# Patient Record
Sex: Male | Born: 1965 | Race: Black or African American | Hispanic: No | Marital: Single | Smoking: Current every day smoker
Health system: Southern US, Community
[De-identification: ages and names within clinical notes are randomized; demographics above are authoritative.]

## PROBLEM LIST (undated history)

## (undated) HISTORY — PX: TUMOR REMOVAL: SHX12

## (undated) HISTORY — PX: REPLACEMENT TOTAL KNEE: SUR1224

## (undated) HISTORY — PX: HERNIA REPAIR: SHX51

---

## 2016-04-30 ENCOUNTER — Ambulatory Visit
Admission: RE | Admit: 2016-04-30 | Discharge: 2016-04-30 | Disposition: A | Discharge status: Home / Self Care | Payer: BLUE CROSS/BLUE SHIELD | Source: Ambulatory Visit | Attending: Internal Medicine | Admitting: Internal Medicine

## 2016-04-30 ENCOUNTER — Ambulatory Visit (INDEPENDENT_AMBULATORY_CARE_PROVIDER_SITE_OTHER): Payer: BLUE CROSS/BLUE SHIELD | Admitting: Internal Medicine

## 2016-04-30 ENCOUNTER — Encounter: Payer: Self-pay | Admitting: Internal Medicine

## 2016-04-30 VITALS — BP 138/84 | HR 64 | Temp 98.6°F | Wt 219.0 lb

## 2016-04-30 DIAGNOSIS — M86661 Other chronic osteomyelitis, right tibia and fibula: Secondary | ICD-10-CM

## 2016-04-30 DIAGNOSIS — Z87828 Personal history of other (healed) physical injury and trauma: Secondary | ICD-10-CM

## 2016-04-30 DIAGNOSIS — Z23 Encounter for immunization: Secondary | ICD-10-CM

## 2016-04-30 DIAGNOSIS — Z8619 Personal history of other infectious and parasitic diseases: Secondary | ICD-10-CM

## 2016-04-30 DIAGNOSIS — B182 Chronic viral hepatitis C: Secondary | ICD-10-CM

## 2016-04-30 LAB — CBC WITH DIFFERENTIAL/PLATELET
BASOS ABS: 0 {cells}/uL (ref 0–200)
Basophils Relative: 0 %
EOS ABS: 336 {cells}/uL (ref 15–500)
EOS PCT: 4 %
HCT: 43.2 % (ref 38.5–50.0)
HEMOGLOBIN: 13.9 g/dL (ref 13.2–17.1)
LYMPHS ABS: 4032 {cells}/uL — AB (ref 850–3900)
Lymphocytes Relative: 48 %
MCH: 28.2 pg (ref 27.0–33.0)
MCHC: 32.2 g/dL (ref 32.0–36.0)
MCV: 87.6 fL (ref 80.0–100.0)
MPV: 9.6 fL (ref 7.5–12.5)
Monocytes Absolute: 672 cells/uL (ref 200–950)
Monocytes Relative: 8 %
NEUTROS PCT: 40 %
Neutro Abs: 3360 cells/uL (ref 1500–7800)
Platelets: 288 10*3/uL (ref 140–400)
RBC: 4.93 MIL/uL (ref 4.20–5.80)
RDW: 15.2 % — ABNORMAL HIGH (ref 11.0–15.0)
WBC: 8.4 10*3/uL (ref 3.8–10.8)

## 2016-04-30 LAB — COMPLETE METABOLIC PANEL WITH GFR
ALBUMIN: 4.3 g/dL (ref 3.6–5.1)
ALK PHOS: 68 U/L (ref 40–115)
ALT: 17 U/L (ref 9–46)
AST: 26 U/L (ref 10–40)
BILIRUBIN TOTAL: 0.4 mg/dL (ref 0.2–1.2)
BUN: 7 mg/dL (ref 7–25)
CO2: 26 mmol/L (ref 20–31)
CREATININE: 0.81 mg/dL (ref 0.60–1.35)
Calcium: 9.5 mg/dL (ref 8.6–10.3)
Chloride: 103 mmol/L (ref 98–110)
GFR, Est African American: >89 mL/min (ref >=60)
GLUCOSE: 80 mg/dL (ref 65–99)
Potassium: 4.1 mmol/L (ref 3.5–5.3)
SODIUM: 139 mmol/L (ref 135–146)
TOTAL PROTEIN: 7.7 g/dL (ref 6.1–8.1)

## 2016-04-30 LAB — PROTIME-INR
INR: 1.1
Prothrombin Time: 11.7 s — ABNORMAL HIGH (ref 9.0–11.5)

## 2016-04-30 MED ORDER — PROMETHAZINE HCL 25 MG PO TABS: 25.0000 mg | ORAL_TABLET | Freq: Four times a day (QID) | ORAL | 0 refills | Status: DC | PRN

## 2016-04-30 MED ORDER — DOXYCYCLINE HYCLATE 100 MG PO TABS: 100.0000 mg | ORAL_TABLET | Freq: Two times a day (BID) | ORAL | 1 refills | Status: DC
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
## 2016-05-01 LAB — HEPATITIS C RNA QUANTITATIVE
HCV Quantitative Log: 6.59 {Log} — ABNORMAL HIGH (ref <1.18)
HCV Quantitative: 3886954 IU/mL — ABNORMAL HIGH (ref <15)

## 2016-05-01 LAB — SEDIMENTATION RATE: Sed Rate: 12 mm/hr (ref 0–15)

## 2016-05-01 LAB — HEPATITIS B SURFACE ANTIBODY,QUALITATIVE: Hep B S Ab: POSITIVE — AB

## 2016-05-01 LAB — HIV ANTIBODY (ROUTINE TESTING W REFLEX): HIV 1&2 Ab, 4th Generation: NONREACTIVE

## 2016-05-01 LAB — C-REACTIVE PROTEIN: CRP: 9.4 mg/L — AB (ref <8.0)

## 2016-05-01 LAB — HEPATITIS A ANTIBODY, TOTAL: Hep A Total Ab: NONREACTIVE

## 2016-05-01 LAB — HEPATITIS C ANTIBODY: HCV Ab: REACTIVE — AB

## 2016-05-03 DIAGNOSIS — Z8619 Personal history of other infectious and parasitic diseases: Secondary | ICD-10-CM | POA: Insufficient documentation

## 2016-05-03 DIAGNOSIS — Z87828 Personal history of other (healed) physical injury and trauma: Secondary | ICD-10-CM | POA: Insufficient documentation

## 2016-05-03 DIAGNOSIS — B182 Chronic viral hepatitis C: Secondary | ICD-10-CM | POA: Insufficient documentation

## 2016-05-03 DIAGNOSIS — M86661 Other chronic osteomyelitis, right tibia and fibula: Secondary | ICD-10-CM

## 2016-05-03 HISTORY — DX: Personal history of other (healed) physical injury and trauma: Z87.828

## 2016-05-03 HISTORY — DX: Other chronic osteomyelitis, right tibia and fibula: M86.661

## 2016-05-03 HISTORY — DX: Chronic viral hepatitis C: B18.2

## 2016-05-03 HISTORY — DX: Personal history of other infectious and parasitic diseases: Z86.19

## 2016-05-03 LAB — WOUND CULTURE
Gram Stain: NONE SEEN
Gram Stain: NONE SEEN
Gram Stain: NONE SEEN
Organism ID, Bacteria: NO GROWTH

## 2016-05-05 LAB — HEPATITIS C RNA QUANTITATIVE
HCV QUANT LOG: 6.52 {Log} — AB (ref <1.18)
HCV QUANT: 3344308 [IU]/mL — AB (ref <15)

## 2016-05-06 LAB — HEPATITIS C GENOTYPE

## 2016-06-02 ENCOUNTER — Ambulatory Visit (INDEPENDENT_AMBULATORY_CARE_PROVIDER_SITE_OTHER): Payer: BLUE CROSS/BLUE SHIELD | Admitting: Internal Medicine

## 2016-06-02 ENCOUNTER — Other Ambulatory Visit: Payer: Self-pay | Admitting: Internal Medicine

## 2016-06-02 VITALS — BP 114/93 | HR 62 | Temp 98.6°F | Wt 220.8 lb

## 2016-06-02 DIAGNOSIS — M86661 Other chronic osteomyelitis, right tibia and fibula: Secondary | ICD-10-CM | POA: Diagnosis not present

## 2016-06-02 DIAGNOSIS — B182 Chronic viral hepatitis C: Secondary | ICD-10-CM

## 2016-06-02 DIAGNOSIS — L988 Other specified disorders of the skin and subcutaneous tissue: Secondary | ICD-10-CM | POA: Diagnosis not present

## 2016-06-02 NOTE — Progress Notes (Signed)
HPI: Doristine SectionBrian Dalesandro is a 50 y.o. male who is here for his appt for osteomyelitis but he also has hep C.   Lab Results  Component Value Date   HCVGENOTYPE 2b 04/30/2016    Allergies: No Known Allergies  Vitals: Temp: 98.6 F (37 C) (12/12 0908) Temp Source: Oral (12/12 0908) BP: 114/93 (12/12 0908) Pulse Rate: 62 (12/12 0908)  Past Medical History: No past medical history on file.  Social History: Social History   Social History  . Marital status: Single    Spouse name: N/A  . Number of children: N/A  . Years of education: N/A   Social History Main Topics  . Smoking status: Current Every Day Smoker    Types: Cigarettes  . Smokeless tobacco: Never Used  . Alcohol use No  . Drug use: No  . Sexual activity: Not on file   Other Topics Concern  . Not on file   Social History Narrative  . No narrative on file    Labs: Hep B S Ab (no units)  Date Value  04/30/2016 POS (A)   HCV Ab (no units)  Date Value  04/30/2016 REACTIVE (A)    Lab Results  Component Value Date   HCVGENOTYPE 2b 04/30/2016    Hepatitis C RNA quantitative Latest Ref Rng & Units 04/30/2016 04/30/2016  HCV Quantitative <15 IU/mL 1,610,960(A3,886,954(H) 5,409,811(B3,344,308(H)  HCV Quantitative Log <1.18 log 10 6.59(H) 6.52(H)    AST (U/L)  Date Value  04/30/2016 26   ALT (U/L)  Date Value  04/30/2016 17   INR (no units)  Date Value  04/30/2016 1.1    CrCl: CrCl cannot be calculated (Patient's most recent lab result is older than the maximum 21 days allowed.).  Fibrosis Score: Fibrosure pending  Child-Pugh Score: Class A  Previous Treatment Regimen: Naive  Assessment: Arlys JohnBrian is here for his visit for osteomyelitis. He has also has chronic hep C 2b. He is currently insured with [employer] through [employer]. Explained the hep C treatment to him and how important it is to be compliance with his meds. More than likely he'll need either 8-12 wks of Mavyret depending on his fibrosis score. I couldn't really  gauge his overall mental capacity, I'm going to bring him back 2 every 2 wks when he is approved for therapy to keep him on track.   Recommendations:  Fibrosure today Mavyret 8-12 wks depending on score F/u every 2 wks until done  Emlyn Maves RushvilleQuang, VermontPharm.D., BCPS, AAHIVP Clinical Infectious Disease Pharmacist [facility] 06/02/2016, 9:51 AM
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
## 2016-06-04 ENCOUNTER — Telehealth: Payer: Self-pay | Admitting: *Deleted

## 2016-06-04 NOTE — Telephone Encounter (Signed)
[facility] and [facility] will call him to set up appt. [phone]

## 2016-06-04 NOTE — Telephone Encounter (Signed)
Patient given appt information for US liver [facility] at [facility]. Arrive 15 minutes early and gave him the phone number if he can not make the appt.

## 2016-06-06 LAB — LIVER FIBROSIS, FIBROTEST-ACTITEST
ALPHA-2-MACROGLOBULIN: 456 mg/dL — AB (ref 106–279)
ALT: 17 U/L (ref 9–46)
APOLIPOPROTEIN A1: 87 mg/dL — AB (ref 94–176)
Bilirubin: 0.2 mg/dL (ref 0.2–1.2)
FIBROSIS SCORE: 0.59
GGT: 14 U/L (ref 3–95)
Haptoglobin: 69 mg/dL (ref 43–212)
Necroinflammat ACT Score: 0.09
REFERENCE ID: 1739854

## 2016-06-08 ENCOUNTER — Other Ambulatory Visit: Payer: Self-pay | Admitting: Pharmacist Clinician (PhC)/ Clinical Pharmacy Specialist

## 2016-06-08 MED ORDER — SOFOSBUVIR-VELPATASVIR 400-100 MG PO TABS: 1.0000 | ORAL_TABLET | Freq: Every day | ORAL | 2 refills | Status: DC

## 2016-06-08 NOTE — Progress Notes (Signed)
Insurance preferred [employer].

## 2016-06-10 ENCOUNTER — Other Ambulatory Visit: Payer: Self-pay | Admitting: Pharmacist

## 2016-06-10 DIAGNOSIS — B182 Chronic viral hepatitis C: Secondary | ICD-10-CM

## 2016-06-10 MED ORDER — SOFOSBUVIR-VELPATASVIR 400-100 MG PO TABS: 1.0000 | ORAL_TABLET | Freq: Every day | ORAL | 2 refills | Status: DC

## 2016-06-17 ENCOUNTER — Ambulatory Visit (HOSPITAL_COMMUNITY): Payer: BLUE CROSS/BLUE SHIELD

## 2016-06-18 ENCOUNTER — Encounter: Payer: Self-pay | Admitting: Pharmacy Technician

## 2016-07-02 ENCOUNTER — Ambulatory Visit: Payer: BLUE CROSS/BLUE SHIELD | Admitting: Internal Medicine

## 2016-09-16 DIAGNOSIS — R200 Anesthesia of skin: Secondary | ICD-10-CM | POA: Insufficient documentation

## 2016-09-16 HISTORY — DX: Anesthesia of skin: R20.0

## 2016-09-18 DIAGNOSIS — Z0000 Encounter for general adult medical examination without abnormal findings: Secondary | ICD-10-CM

## 2016-09-18 DIAGNOSIS — R202 Paresthesia of skin: Secondary | ICD-10-CM | POA: Insufficient documentation

## 2016-09-18 DIAGNOSIS — M79602 Pain in left arm: Secondary | ICD-10-CM | POA: Insufficient documentation

## 2016-09-18 HISTORY — DX: Paresthesia of skin: R20.2

## 2016-09-18 HISTORY — DX: Encounter for general adult medical examination without abnormal findings: Z00.00

## 2016-09-18 HISTORY — DX: Pain in left arm: M79.602

## 2017-12-21 IMAGING — CR DG TIBIA/FIBULA 2V*R*
4 series · 4 of 4 positions shown · non-contrast
Comparison: December 21, 2015

CLINICAL DATA: Chronic osteomyelitis with draining sinus

EXAM:
RIGHT TIBIA AND FIBULA - 2 VIEW

[t tib/fib ap right (1 of 2)]
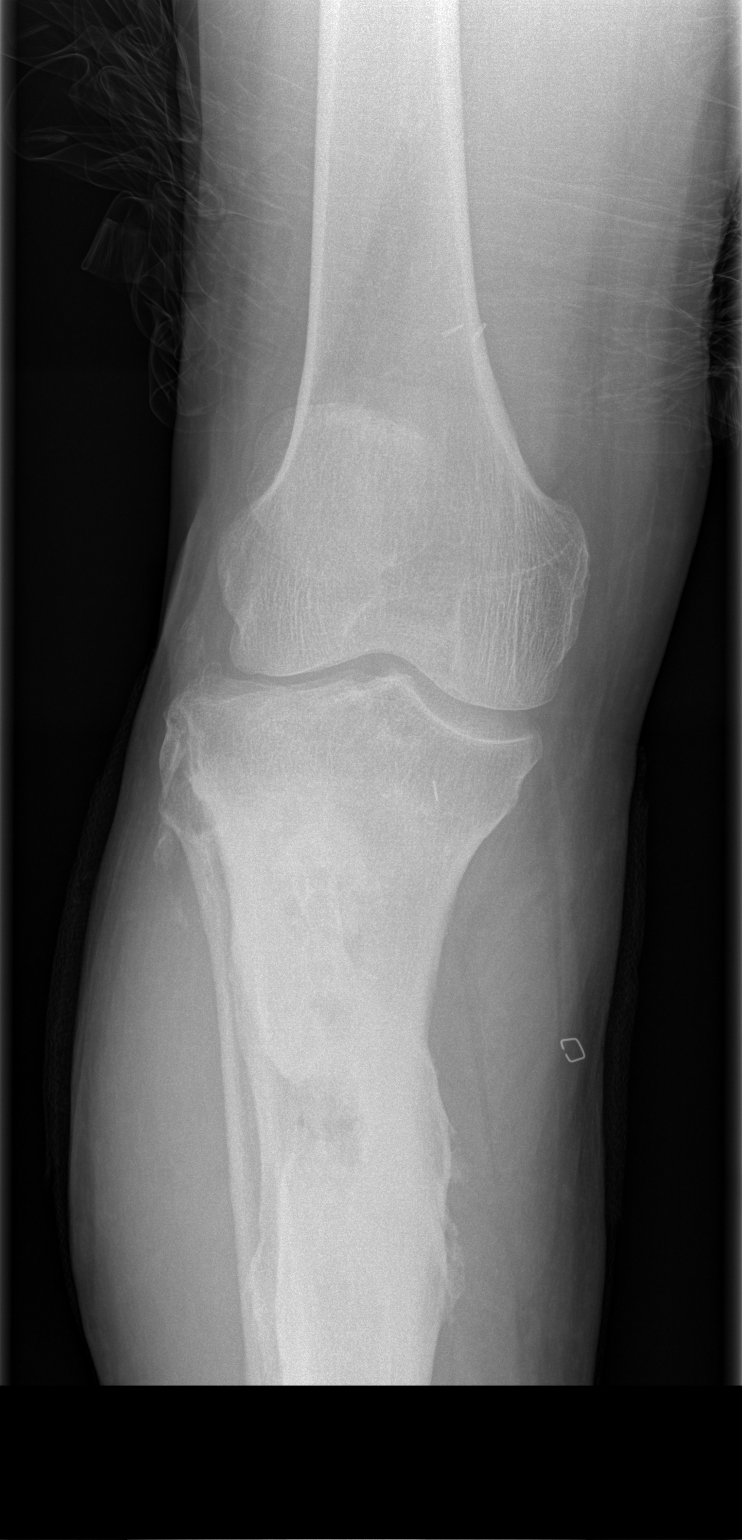

[t tib/fib ap right (2 of 2)]
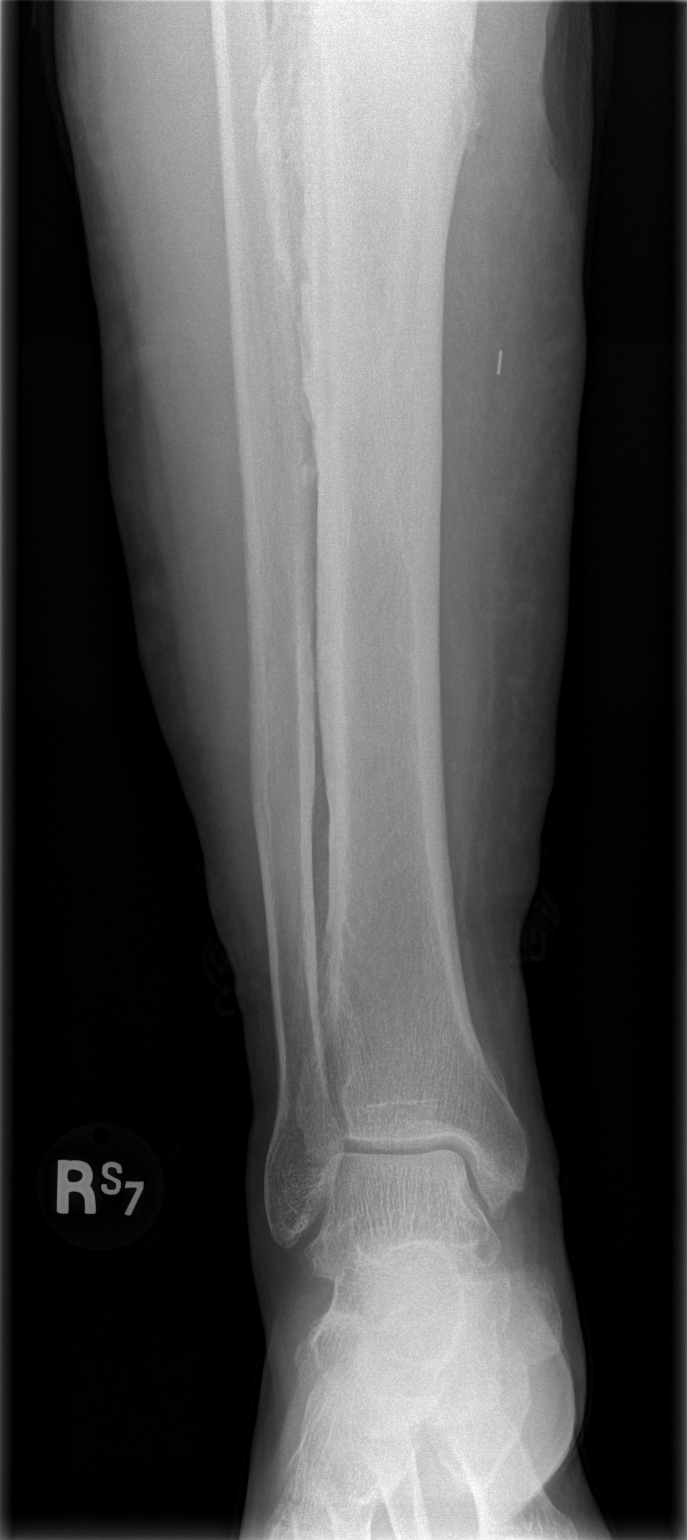

[t tib/fib lat right (1 of 2)]
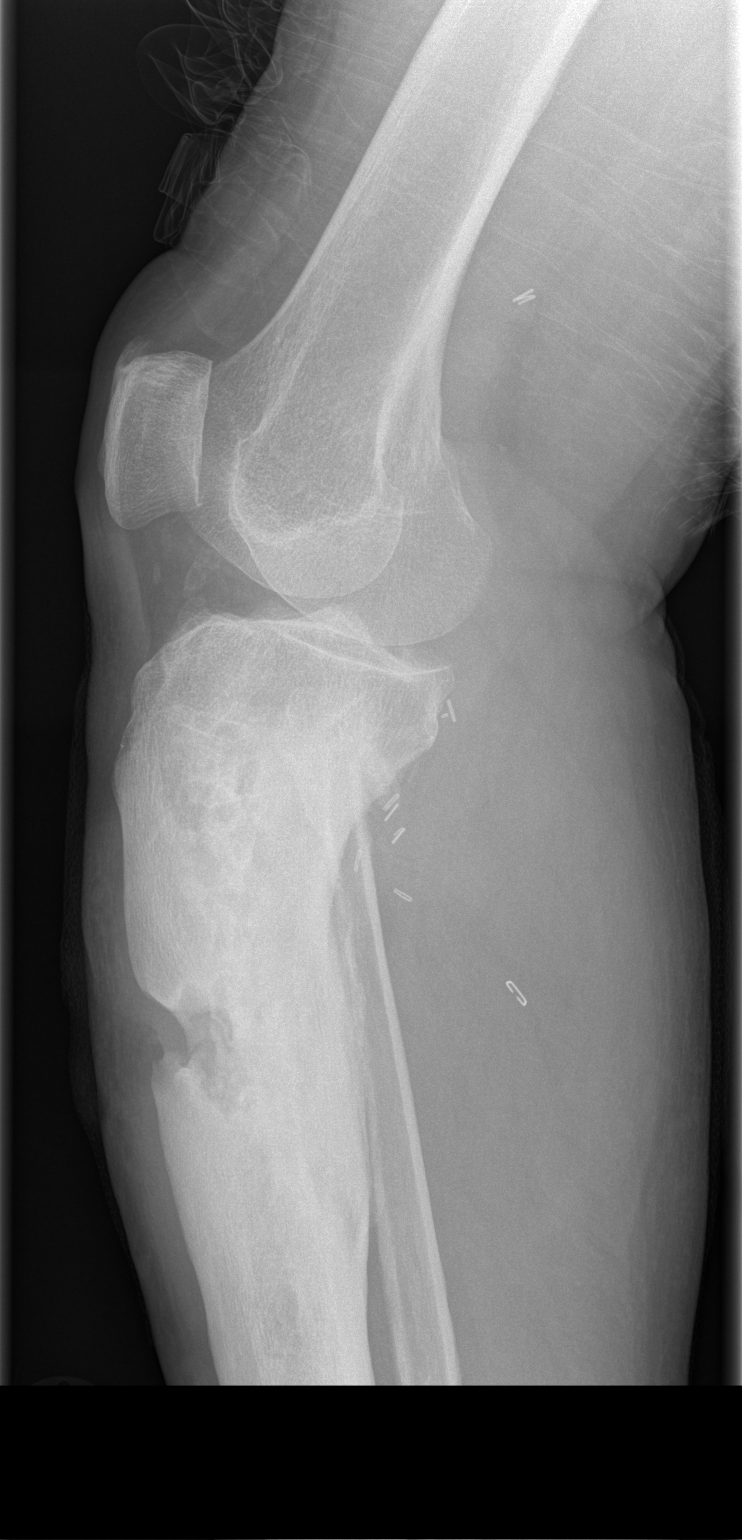

[t tib/fib lat right (2 of 2)]
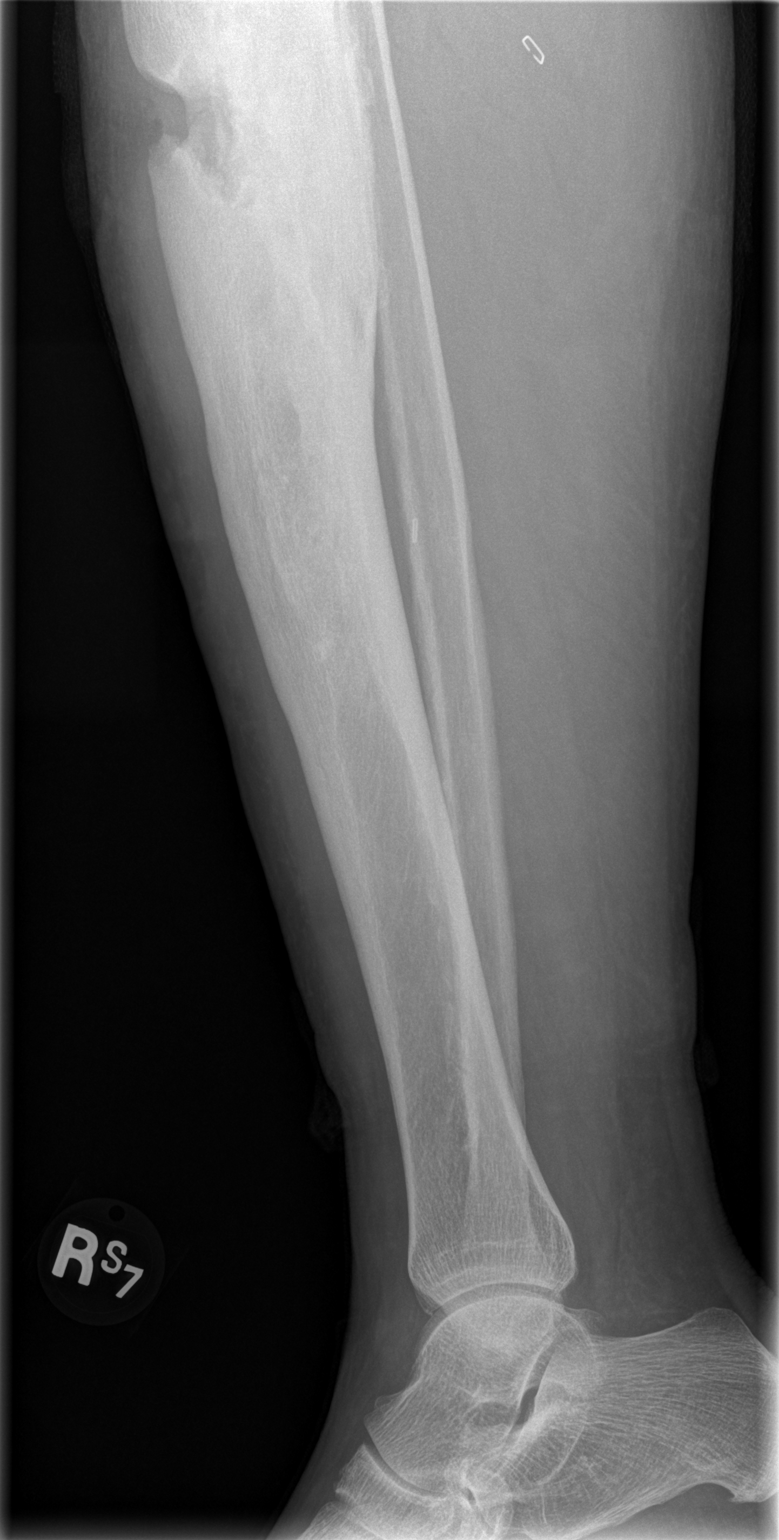

[4 of 4 positions shown; findings below may reference images not displayed]

FINDINGS: Frontal and lateral views were obtained. There is a stable appearing
lytic lesion in the proximal tibial diaphysis with loss of cortex
anteriorly. Areas of mixed sclerosis and lucency are noted
throughout the proximal half of the tibia. No acute fracture or
dislocation is evident. There is periosteal reaction throughout the
proximal tibial diaphysis consistent with previous remodeling.

The knee and hip joints appear unremarkable.
IMPRESSION: Chronic osteomyelitis and evidence of old trauma, stable. No new
sclerosis or lucency seen. There has been no progression of loss of
cortex along the proximal tibial diaphysis. No new lesion evident.
No acute fracture or dislocation.

## 2019-06-14 DIAGNOSIS — Z20828 Contact with and (suspected) exposure to other viral communicable diseases: Secondary | ICD-10-CM | POA: Diagnosis not present

## 2019-07-15 DIAGNOSIS — K029 Dental caries, unspecified: Secondary | ICD-10-CM | POA: Diagnosis not present

## 2019-07-15 DIAGNOSIS — R6884 Jaw pain: Secondary | ICD-10-CM | POA: Diagnosis not present

## 2019-07-18 DIAGNOSIS — M545 Low back pain: Secondary | ICD-10-CM | POA: Diagnosis not present

## 2019-09-27 DIAGNOSIS — L03317 Cellulitis of buttock: Secondary | ICD-10-CM | POA: Diagnosis not present

## 2019-11-19 DIAGNOSIS — L02415 Cutaneous abscess of right lower limb: Secondary | ICD-10-CM | POA: Diagnosis not present

## 2020-01-09 DIAGNOSIS — L02212 Cutaneous abscess of back [any part, except buttock]: Secondary | ICD-10-CM | POA: Diagnosis not present

## 2020-01-09 DIAGNOSIS — L03116 Cellulitis of left lower limb: Secondary | ICD-10-CM | POA: Diagnosis not present

## 2020-01-30 DIAGNOSIS — L720 Epidermal cyst: Secondary | ICD-10-CM | POA: Diagnosis not present

## 2020-01-30 DIAGNOSIS — L02212 Cutaneous abscess of back [any part, except buttock]: Secondary | ICD-10-CM | POA: Diagnosis not present

## 2020-02-05 DIAGNOSIS — L97911 Non-pressure chronic ulcer of unspecified part of right lower leg limited to breakdown of skin: Secondary | ICD-10-CM | POA: Diagnosis not present

## 2020-02-05 DIAGNOSIS — L720 Epidermal cyst: Secondary | ICD-10-CM | POA: Diagnosis not present

## 2020-06-29 DIAGNOSIS — R0789 Other chest pain: Secondary | ICD-10-CM | POA: Diagnosis not present

## 2020-06-29 DIAGNOSIS — R5383 Other fatigue: Secondary | ICD-10-CM | POA: Diagnosis not present

## 2020-06-29 DIAGNOSIS — U071 COVID-19: Secondary | ICD-10-CM | POA: Diagnosis not present

## 2020-06-29 DIAGNOSIS — Z20822 Contact with and (suspected) exposure to covid-19: Secondary | ICD-10-CM | POA: Diagnosis not present

## 2020-10-22 ENCOUNTER — Ambulatory Visit: Payer: Self-pay | Admitting: Sports Medicine

## 2020-12-10 DIAGNOSIS — R1032 Left lower quadrant pain: Secondary | ICD-10-CM | POA: Diagnosis not present

## 2020-12-16 DIAGNOSIS — R1032 Left lower quadrant pain: Secondary | ICD-10-CM | POA: Diagnosis not present

## 2020-12-16 DIAGNOSIS — Z6830 Body mass index (BMI) 30.0-30.9, adult: Secondary | ICD-10-CM | POA: Diagnosis not present

## 2020-12-16 DIAGNOSIS — Z113 Encounter for screening for infections with a predominantly sexual mode of transmission: Secondary | ICD-10-CM | POA: Diagnosis not present

## 2020-12-16 DIAGNOSIS — R635 Abnormal weight gain: Secondary | ICD-10-CM | POA: Diagnosis not present

## 2020-12-17 DIAGNOSIS — Z113 Encounter for screening for infections with a predominantly sexual mode of transmission: Secondary | ICD-10-CM | POA: Diagnosis not present

## 2020-12-17 DIAGNOSIS — R635 Abnormal weight gain: Secondary | ICD-10-CM | POA: Diagnosis not present

## 2020-12-30 DIAGNOSIS — R1032 Left lower quadrant pain: Secondary | ICD-10-CM | POA: Diagnosis not present

## 2020-12-30 DIAGNOSIS — Z6830 Body mass index (BMI) 30.0-30.9, adult: Secondary | ICD-10-CM | POA: Diagnosis not present

## 2020-12-30 DIAGNOSIS — R7303 Prediabetes: Secondary | ICD-10-CM | POA: Diagnosis not present

## 2021-01-07 DIAGNOSIS — R590 Localized enlarged lymph nodes: Secondary | ICD-10-CM | POA: Diagnosis not present

## 2021-01-07 DIAGNOSIS — R1030 Lower abdominal pain, unspecified: Secondary | ICD-10-CM | POA: Diagnosis not present

## 2021-01-07 DIAGNOSIS — R1032 Left lower quadrant pain: Secondary | ICD-10-CM | POA: Diagnosis not present

## 2021-01-14 DIAGNOSIS — K4090 Unilateral inguinal hernia, without obstruction or gangrene, not specified as recurrent: Secondary | ICD-10-CM | POA: Diagnosis not present

## 2021-01-14 DIAGNOSIS — Z125 Encounter for screening for malignant neoplasm of prostate: Secondary | ICD-10-CM | POA: Diagnosis not present

## 2021-01-14 DIAGNOSIS — Z87891 Personal history of nicotine dependence: Secondary | ICD-10-CM | POA: Diagnosis not present

## 2021-01-14 DIAGNOSIS — Z0000 Encounter for general adult medical examination without abnormal findings: Secondary | ICD-10-CM | POA: Diagnosis not present

## 2021-01-14 DIAGNOSIS — Z13220 Encounter for screening for lipoid disorders: Secondary | ICD-10-CM | POA: Diagnosis not present

## 2021-02-11 DIAGNOSIS — K4020 Bilateral inguinal hernia, without obstruction or gangrene, not specified as recurrent: Secondary | ICD-10-CM | POA: Diagnosis not present

## 2021-02-11 DIAGNOSIS — K4090 Unilateral inguinal hernia, without obstruction or gangrene, not specified as recurrent: Secondary | ICD-10-CM | POA: Diagnosis not present

## 2021-02-18 DIAGNOSIS — R590 Localized enlarged lymph nodes: Secondary | ICD-10-CM | POA: Diagnosis not present

## 2021-02-18 DIAGNOSIS — R7303 Prediabetes: Secondary | ICD-10-CM | POA: Diagnosis not present

## 2021-02-18 DIAGNOSIS — Z6830 Body mass index (BMI) 30.0-30.9, adult: Secondary | ICD-10-CM | POA: Diagnosis not present

## 2021-02-18 DIAGNOSIS — K4020 Bilateral inguinal hernia, without obstruction or gangrene, not specified as recurrent: Secondary | ICD-10-CM | POA: Diagnosis not present

## 2021-03-25 DIAGNOSIS — K4020 Bilateral inguinal hernia, without obstruction or gangrene, not specified as recurrent: Secondary | ICD-10-CM | POA: Diagnosis not present

## 2021-06-20 DIAGNOSIS — R509 Fever, unspecified: Secondary | ICD-10-CM | POA: Diagnosis not present

## 2021-06-20 DIAGNOSIS — I10 Essential (primary) hypertension: Secondary | ICD-10-CM | POA: Diagnosis not present

## 2021-06-20 DIAGNOSIS — M79605 Pain in left leg: Secondary | ICD-10-CM | POA: Diagnosis not present

## 2021-06-21 DIAGNOSIS — K5730 Diverticulosis of large intestine without perforation or abscess without bleeding: Secondary | ICD-10-CM | POA: Diagnosis not present

## 2021-06-21 DIAGNOSIS — K4020 Bilateral inguinal hernia, without obstruction or gangrene, not specified as recurrent: Secondary | ICD-10-CM | POA: Diagnosis not present

## 2021-06-21 DIAGNOSIS — K429 Umbilical hernia without obstruction or gangrene: Secondary | ICD-10-CM | POA: Diagnosis not present

## 2021-06-21 DIAGNOSIS — N400 Enlarged prostate without lower urinary tract symptoms: Secondary | ICD-10-CM | POA: Diagnosis not present

## 2021-06-21 DIAGNOSIS — K4090 Unilateral inguinal hernia, without obstruction or gangrene, not specified as recurrent: Secondary | ICD-10-CM | POA: Diagnosis not present

## 2021-06-21 DIAGNOSIS — R109 Unspecified abdominal pain: Secondary | ICD-10-CM | POA: Diagnosis not present

## 2021-07-08 DIAGNOSIS — K429 Umbilical hernia without obstruction or gangrene: Secondary | ICD-10-CM | POA: Diagnosis not present

## 2021-07-08 DIAGNOSIS — K4020 Bilateral inguinal hernia, without obstruction or gangrene, not specified as recurrent: Secondary | ICD-10-CM | POA: Diagnosis not present

## 2021-07-14 DIAGNOSIS — K4020 Bilateral inguinal hernia, without obstruction or gangrene, not specified as recurrent: Secondary | ICD-10-CM | POA: Diagnosis not present

## 2021-07-14 DIAGNOSIS — K429 Umbilical hernia without obstruction or gangrene: Secondary | ICD-10-CM | POA: Diagnosis not present

## 2021-07-14 DIAGNOSIS — K4091 Unilateral inguinal hernia, without obstruction or gangrene, recurrent: Secondary | ICD-10-CM | POA: Diagnosis not present

## 2021-07-14 DIAGNOSIS — K4021 Bilateral inguinal hernia, without obstruction or gangrene, recurrent: Secondary | ICD-10-CM | POA: Diagnosis not present

## 2021-07-21 DIAGNOSIS — R6884 Jaw pain: Secondary | ICD-10-CM | POA: Diagnosis not present

## 2021-07-21 DIAGNOSIS — K0889 Other specified disorders of teeth and supporting structures: Secondary | ICD-10-CM | POA: Diagnosis not present

## 2021-10-21 DIAGNOSIS — H6121 Impacted cerumen, right ear: Secondary | ICD-10-CM | POA: Diagnosis not present

## 2021-11-24 DIAGNOSIS — F4322 Adjustment disorder with anxiety: Secondary | ICD-10-CM | POA: Diagnosis not present

## 2022-06-09 DIAGNOSIS — M1712 Unilateral primary osteoarthritis, left knee: Secondary | ICD-10-CM | POA: Diagnosis not present

## 2022-06-09 DIAGNOSIS — M25562 Pain in left knee: Secondary | ICD-10-CM | POA: Diagnosis not present

## 2022-07-27 DIAGNOSIS — Z1331 Encounter for screening for depression: Secondary | ICD-10-CM | POA: Diagnosis not present

## 2022-07-27 DIAGNOSIS — R9431 Abnormal electrocardiogram [ECG] [EKG]: Secondary | ICD-10-CM | POA: Diagnosis not present

## 2022-07-27 DIAGNOSIS — Z01818 Encounter for other preprocedural examination: Secondary | ICD-10-CM | POA: Diagnosis not present

## 2022-07-27 DIAGNOSIS — R590 Localized enlarged lymph nodes: Secondary | ICD-10-CM | POA: Diagnosis not present

## 2022-07-27 DIAGNOSIS — Z6830 Body mass index (BMI) 30.0-30.9, adult: Secondary | ICD-10-CM | POA: Diagnosis not present

## 2022-08-03 DIAGNOSIS — R590 Localized enlarged lymph nodes: Secondary | ICD-10-CM | POA: Diagnosis not present

## 2022-08-03 DIAGNOSIS — Z136 Encounter for screening for cardiovascular disorders: Secondary | ICD-10-CM | POA: Diagnosis not present

## 2022-08-03 DIAGNOSIS — R9431 Abnormal electrocardiogram [ECG] [EKG]: Secondary | ICD-10-CM | POA: Diagnosis not present

## 2022-08-03 DIAGNOSIS — D179 Benign lipomatous neoplasm, unspecified: Secondary | ICD-10-CM | POA: Diagnosis not present

## 2022-09-24 DIAGNOSIS — Z87891 Personal history of nicotine dependence: Secondary | ICD-10-CM | POA: Diagnosis not present

## 2022-09-24 DIAGNOSIS — M1712 Unilateral primary osteoarthritis, left knee: Secondary | ICD-10-CM | POA: Diagnosis not present

## 2022-09-24 DIAGNOSIS — Z0000 Encounter for general adult medical examination without abnormal findings: Secondary | ICD-10-CM | POA: Diagnosis not present

## 2022-10-19 DIAGNOSIS — I700 Atherosclerosis of aorta: Secondary | ICD-10-CM | POA: Diagnosis not present

## 2022-10-19 DIAGNOSIS — R590 Localized enlarged lymph nodes: Secondary | ICD-10-CM | POA: Diagnosis not present

## 2022-10-19 DIAGNOSIS — J439 Emphysema, unspecified: Secondary | ICD-10-CM | POA: Diagnosis not present

## 2022-10-19 DIAGNOSIS — M179 Osteoarthritis of knee, unspecified: Secondary | ICD-10-CM | POA: Diagnosis not present

## 2022-10-19 DIAGNOSIS — Z6832 Body mass index (BMI) 32.0-32.9, adult: Secondary | ICD-10-CM | POA: Diagnosis not present

## 2022-12-10 DIAGNOSIS — Z01810 Encounter for preprocedural cardiovascular examination: Secondary | ICD-10-CM | POA: Diagnosis not present

## 2022-12-10 DIAGNOSIS — M25562 Pain in left knee: Secondary | ICD-10-CM | POA: Diagnosis not present

## 2022-12-10 DIAGNOSIS — Z22322 Carrier or suspected carrier of Methicillin resistant Staphylococcus aureus: Secondary | ICD-10-CM | POA: Diagnosis not present

## 2022-12-10 DIAGNOSIS — M25569 Pain in unspecified knee: Secondary | ICD-10-CM | POA: Diagnosis not present

## 2022-12-10 DIAGNOSIS — M1611 Unilateral primary osteoarthritis, right hip: Secondary | ICD-10-CM | POA: Diagnosis not present

## 2022-12-10 DIAGNOSIS — Z01818 Encounter for other preprocedural examination: Secondary | ICD-10-CM | POA: Diagnosis not present

## 2022-12-10 DIAGNOSIS — M1712 Unilateral primary osteoarthritis, left knee: Secondary | ICD-10-CM | POA: Diagnosis not present

## 2022-12-24 DIAGNOSIS — M1712 Unilateral primary osteoarthritis, left knee: Secondary | ICD-10-CM | POA: Diagnosis not present

## 2022-12-24 DIAGNOSIS — M25562 Pain in left knee: Secondary | ICD-10-CM | POA: Diagnosis not present

## 2023-01-01 DIAGNOSIS — Z96652 Presence of left artificial knee joint: Secondary | ICD-10-CM | POA: Diagnosis not present

## 2023-01-01 DIAGNOSIS — E669 Obesity, unspecified: Secondary | ICD-10-CM | POA: Diagnosis not present

## 2023-01-01 DIAGNOSIS — Z6833 Body mass index (BMI) 33.0-33.9, adult: Secondary | ICD-10-CM | POA: Diagnosis not present

## 2023-01-01 DIAGNOSIS — M1712 Unilateral primary osteoarthritis, left knee: Secondary | ICD-10-CM | POA: Diagnosis not present

## 2023-01-01 DIAGNOSIS — G8918 Other acute postprocedural pain: Secondary | ICD-10-CM | POA: Diagnosis not present

## 2023-01-02 DIAGNOSIS — M1712 Unilateral primary osteoarthritis, left knee: Secondary | ICD-10-CM | POA: Diagnosis not present

## 2023-01-06 DIAGNOSIS — Z471 Aftercare following joint replacement surgery: Secondary | ICD-10-CM

## 2023-01-06 DIAGNOSIS — Z7982 Long term (current) use of aspirin: Secondary | ICD-10-CM | POA: Insufficient documentation

## 2023-01-06 DIAGNOSIS — Z96652 Presence of left artificial knee joint: Secondary | ICD-10-CM

## 2023-01-06 HISTORY — DX: Long term (current) use of aspirin: Z79.82

## 2023-01-06 HISTORY — DX: Aftercare following joint replacement surgery: Z47.1

## 2023-01-06 HISTORY — DX: Presence of left artificial knee joint: Z96.652

## 2023-01-08 DIAGNOSIS — M1712 Unilateral primary osteoarthritis, left knee: Secondary | ICD-10-CM | POA: Diagnosis not present

## 2023-01-12 DIAGNOSIS — M1712 Unilateral primary osteoarthritis, left knee: Secondary | ICD-10-CM | POA: Diagnosis not present

## 2023-01-13 DIAGNOSIS — Z96652 Presence of left artificial knee joint: Secondary | ICD-10-CM | POA: Diagnosis not present

## 2023-01-13 DIAGNOSIS — M1712 Unilateral primary osteoarthritis, left knee: Secondary | ICD-10-CM | POA: Diagnosis not present

## 2023-01-13 DIAGNOSIS — Z471 Aftercare following joint replacement surgery: Secondary | ICD-10-CM | POA: Diagnosis not present

## 2023-01-14 DIAGNOSIS — M1712 Unilateral primary osteoarthritis, left knee: Secondary | ICD-10-CM | POA: Diagnosis not present

## 2023-01-18 DIAGNOSIS — M1712 Unilateral primary osteoarthritis, left knee: Secondary | ICD-10-CM | POA: Diagnosis not present

## 2023-01-20 DIAGNOSIS — M1712 Unilateral primary osteoarthritis, left knee: Secondary | ICD-10-CM | POA: Diagnosis not present

## 2023-01-21 DIAGNOSIS — L03116 Cellulitis of left lower limb: Secondary | ICD-10-CM | POA: Diagnosis not present

## 2023-01-21 DIAGNOSIS — T8140XA Infection following a procedure, unspecified, initial encounter: Secondary | ICD-10-CM | POA: Diagnosis not present

## 2023-01-27 DIAGNOSIS — M25562 Pain in left knee: Secondary | ICD-10-CM | POA: Diagnosis not present

## 2023-02-12 DIAGNOSIS — Z96652 Presence of left artificial knee joint: Secondary | ICD-10-CM | POA: Diagnosis not present

## 2023-02-12 DIAGNOSIS — Z471 Aftercare following joint replacement surgery: Secondary | ICD-10-CM | POA: Diagnosis not present

## 2023-02-12 DIAGNOSIS — Z7982 Long term (current) use of aspirin: Secondary | ICD-10-CM | POA: Diagnosis not present

## 2023-03-11 DIAGNOSIS — Z96652 Presence of left artificial knee joint: Secondary | ICD-10-CM | POA: Diagnosis not present

## 2023-03-11 DIAGNOSIS — Z471 Aftercare following joint replacement surgery: Secondary | ICD-10-CM | POA: Diagnosis not present

## 2023-06-17 ENCOUNTER — Ambulatory Visit: Payer: Medicaid Other | Attending: Cardiology | Admitting: Cardiology

## 2023-07-06 ENCOUNTER — Ambulatory Visit: Payer: Medicaid Other

## 2023-07-07 ENCOUNTER — Ambulatory Visit: Payer: Medicaid Other

## 2023-07-20 ENCOUNTER — Ambulatory Visit: Payer: Medicaid Other

## 2023-07-20 VITALS — BP 124/84 | HR 90 | Ht 69.0 in | Wt 244.2 lb

## 2023-07-20 DIAGNOSIS — R079 Chest pain, unspecified: Secondary | ICD-10-CM | POA: Diagnosis not present

## 2023-07-20 MED ORDER — METOPROLOL TARTRATE 100 MG PO TABS: 100.0000 mg | ORAL_TABLET | Freq: Once | ORAL | 0 refills | Status: DC

## 2023-07-20 NOTE — Patient Instructions (Signed)
Medication Instructions:   Take Metoprolol 100 mg two hours before your CT  *If you need a refill on your cardiac medications before your next appointment, please call your pharmacy*   Lab Work: None  If you have labs (blood work) drawn today and your tests are completely normal, you will receive your results only by: MyChart Message (if you have MyChart) OR A paper copy in the mail If you have any lab test that is abnormal or we need to change your treatment, we will call you to review the results.   Testing/Procedures: Echocardiogram An echocardiogram is a test that uses sound waves (ultrasound) to produce images of the heart. Images from an echocardiogram can provide important information about: Heart size and shape. The size and thickness and movement of your heart's walls. Heart muscle function and strength. Heart valve function or if you have stenosis. Stenosis is when the heart valves are too narrow. If blood is flowing backward through the heart valves (regurgitation). A tumor or infectious growth around the heart valves. Areas of heart muscle that are not working well because of poor blood flow or injury from a heart attack. Aneurysm detection. An aneurysm is a weak or damaged part of an artery wall. The wall bulges out from the normal force of blood pumping through the body. Tell a health care provider about: Any allergies you have. All medicines you are taking, including vitamins, herbs, eye drops, creams, and over-the-counter medicines. Any blood disorders you have. Any surgeries you have had. Any medical conditions you have. Whether you are pregnant or may be pregnant. What are the risks? Generally, this is a safe test. However, problems may occur, including an allergic reaction to dye (contrast) that may be used during the test. What happens before the test? No specific preparation is needed. You may eat and drink normally. What happens during the test?  You will  take off your clothes from the waist up and put on a hospital gown. Electrodes or electrocardiogram (ECG)patches may be placed on your chest. The electrodes or patches are then connected to a device that monitors your heart rate and rhythm. You will lie down on a table for an ultrasound exam. A gel will be applied to your chest to help sound waves pass through your skin. A handheld device, called a transducer, will be pressed against your chest and moved over your heart. The transducer produces sound waves that travel to your heart and bounce back (or "echo" back) to the transducer. These sound waves will be captured in real-time and changed into images of your heart that can be viewed on a video monitor. The images will be recorded on a computer and reviewed by your health care provider. You may be asked to change positions or hold your breath for a short time. This makes it easier to get different views or better views of your heart. In some cases, you may receive contrast through an IV in one of your veins. This can improve the quality of the pictures from your heart. The procedure may vary among health care providers and hospitals. What can I expect after the test? You may return to your normal, everyday life, including diet, activities, and medicines, unless your health care provider tells you not to do that. Follow these instructions at home: It is up to you to get the results of your test. Ask your health care provider, or the department that is doing the test, when your results will be  ready. Keep all follow-up visits. This is important. Summary An echocardiogram is a test that uses sound waves (ultrasound) to produce images of the heart. Images from an echocardiogram can provide important information about the size and shape of your heart, heart muscle function, heart valve function, and other possible heart problems. You do not need to do anything to prepare before this test. You may eat and  drink normally. After the echocardiogram is completed, you may return to your normal, everyday life, unless your health care provider tells you not to do that. This information is not intended to replace advice given to you by your health care provider. Make sure you discuss any questions you have with your health care provider. Document Revised: 02/19/2021 Document Reviewed: 01/30/2020 Elsevier Patient Education  2023 Elsevier Inc.       Your cardiac CT will be scheduled at one of the below locations:   [facility] [address] [place], [place] [address] [phone]   At [facility], please arrive at the [facility] and Children's Entrance (Entrance C2) of [facility] 30 minutes prior to test start time. You can use the FREE valet parking offered at entrance C (encouraged to control the heart rate for the test)  Proceed to the [facility] Radiology Department (first floor) to check-in and test prep.  All radiology patients and guests should use entrance C2 at [facility], accessed from [facility], even though the hospital's physical address listed is [address]      Please follow these instructions carefully (unless otherwise directed):  Hold all erectile dysfunction medications at least 3 days (72 hrs) prior to test.  On the Night Before the Test: Be sure to Drink plenty of water. Do not consume any caffeinated/decaffeinated beverages or chocolate 12 hours prior to your test. Do not take any antihistamines 12 hours prior to your test.  On the Day of the Test: Drink plenty of water until 1 hour prior to the test. Do not eat any food 4 hours prior to the test. You may take your regular medications prior to the test.  Take metoprolol (Lopressor) two hours prior to test.        After the Test: Drink plenty of water. After receiving IV contrast, you may experience a mild flushed feeling. This is normal. On  occasion, you may experience a mild rash up to 24 hours after the test. This is not dangerous. If this occurs, you can take Benadryl 25 mg and increase your fluid intake. If you experience trouble breathing, this can be serious. If it is severe call 911 IMMEDIATELY. If it is mild, please call our office. If you take any of these medications: Glipizide/Metformin, Avandament, Glucavance, please do not take 48 hours after completing test unless otherwise instructed.  We will call to schedule your test 2-4 weeks out understanding that some insurance companies will need an authorization prior to the service being performed.   For non-scheduling related questions, please contact the cardiac imaging nurse navigator should you have any questions/concerns: Rockwell Alexandria, Cardiac Imaging Nurse Navigator Larey Brick, Cardiac Imaging Nurse Navigator [facility] Direct Office Dial: [phone]   For scheduling needs, including cancellations and rescheduling, please call [place], [phone].    Follow-Up: At [facility], you and your health needs are our priority.  As part of our continuing mission to provide you with exceptional heart care, we have created designated Provider Care Teams.  These Care Teams include your primary Cardiologist (physician) and Advanced Practice Providers (APPs -  Physician Assistants and Nurse Practitioners) who all work together to provide you with the care you need, when you need it.  We recommend signing up for the patient portal called "MyChart".  Sign up information is provided on this After Visit Summary.  MyChart is used to connect with patients for Virtual Visits (Telemedicine).  Patients are able to view lab/test results, encounter notes, upcoming appointments, etc.  Non-urgent messages can be sent to your provider as well.   To learn more about what you can do with MyChart, go to [email or web address].    Your next appointment:    Based on results    Other Instructions Cardiac CT Angiogram A cardiac CT angiogram is a procedure to look at the heart and the area around the heart. It may be done to help find the cause of chest pains or other symptoms of heart disease. During this procedure, a substance called contrast dye is injected into the blood vessels in the area to be checked. A large X-ray machine, called a CT scanner, then takes detailed pictures of the heart and the surrounding area. The procedure is also sometimes called a coronary CT angiogram, coronary artery scanning, or CTA. A cardiac CT angiogram allows the health care provider to see how well blood is flowing to and from the heart. The health care provider will be able to see if there are any problems, such as: Blockage or narrowing of the coronary arteries in the heart. Fluid around the heart. Signs of weakness or disease in the muscles, valves, and tissues of the heart. Tell a health care provider about: Any allergies you have. This is especially important if you have had a previous allergic reaction to contrast dye. All medicines you are taking, including vitamins, herbs, eye drops, creams, and over-the-counter medicines. Any blood disorders you have. Any surgeries you have had. Any medical conditions you have. Whether you are pregnant or may be pregnant. Any anxiety disorders, chronic pain, or other conditions you have that may increase your stress or prevent you from lying still. What are the risks? Generally, this is a safe procedure. However, problems may occur, including: Bleeding. Infection. Allergic reactions to medicines or dyes. Damage to other structures or organs. Kidney damage from the contrast dye that is used. Increased risk of cancer from radiation exposure. This risk is low. Talk with your health care provider about: The risks and benefits of testing. How you can receive the lowest dose of radiation. What happens before the  procedure? Wear comfortable clothing and remove any jewelry, glasses, dentures, and hearing aids. Follow instructions from your health care provider about eating and drinking. This may include: For 12 hours before the procedure -- avoid caffeine. This includes tea, coffee, soda, energy drinks, and diet pills. Drink plenty of water or other fluids that do not have caffeine in them. Being well hydrated can prevent complications. For 4-6 hours before the procedure -- stop eating and drinking. The contrast dye can cause nausea, but this is less likely if your stomach is empty. Ask your health care provider about changing or stopping your regular medicines. This is especially important if you are taking diabetes medicines, blood thinners, or medicines to treat problems with erections (erectile dysfunction). What happens during the procedure?  Hair on your chest may need to be removed so that small sticky patches called electrodes can be placed on your chest. These will transmit  information that helps to monitor your heart during the procedure. An IV will be inserted into one of your veins. You might be given a medicine to control your heart rate during the procedure. This will help to ensure that good images are obtained. You will be asked to lie on an exam table. This table will slide in and out of the CT machine during the procedure. Contrast dye will be injected into the IV. You might feel warm, or you may get a metallic taste in your mouth. You will be given a medicine called nitroglycerin. This will relax or dilate the arteries in your heart. The table that you are lying on will move into the CT machine tunnel for the scan. The person running the machine will give you instructions while the scans are being done. You may be asked to: Keep your arms above your head. Hold your breath. Stay very still, even if the table is moving. When the scanning is complete, you will be moved out of the  machine. The IV will be removed. The procedure may vary among health care providers and hospitals. What can I expect after the procedure? After your procedure, it is common to have: A metallic taste in your mouth from the contrast dye. A feeling of warmth. A headache from the nitroglycerin. Follow these instructions at home: Take over-the-counter and prescription medicines only as told by your health care provider. If you are told, drink enough fluid to keep your urine pale yellow. This will help to flush the contrast dye out of your body. Most people can return to their normal activities right after the procedure. Ask your health care provider what activities are safe for you. It is up to you to get the results of your procedure. Ask your health care provider, or the department that is doing the procedure, when your results will be ready. Keep all follow-up visits as told by your health care provider. This is important. Contact a health care provider if: You have any symptoms of allergy to the contrast dye. These include: Shortness of breath. Rash or hives. A racing heartbeat. Summary A cardiac CT angiogram is a procedure to look at the heart and the area around the heart. It may be done to help find the cause of chest pains or other symptoms of heart disease. During this procedure, a large X-ray machine, called a CT scanner, takes detailed pictures of the heart and the surrounding area after a contrast dye has been injected into blood vessels in the area. Ask your health care provider about changing or stopping your regular medicines before the procedure. This is especially important if you are taking diabetes medicines, blood thinners, or medicines to treat erectile dysfunction. If you are told, drink enough fluid to keep your urine pale yellow. This will help to flush the contrast dye out of your body. This information is not intended to replace advice given to you by your health care  provider. Make sure you discuss any questions you have with your health care provider. Document Revised: 02/01/2019 Document Reviewed: 02/01/2019 Elsevier Patient Education  2020 [employer].
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
## 2023-07-20 NOTE — Assessment & Plan Note (Signed)
He does have exertional symptoms of chest pain as reported below in HPI. He had an intense episode 4 weeks ago that led to ER visit but signed out AMA. EKG today in the clinic shows abnormalities in lateral precordial leads suggestive of ischemia.  He currently denies any chest pain in the office wall and over the last couple weeks. Advised him to be watchful if he has any symptoms of chest pain or shortness of breath to head to the nearest ER or call 911 right away.  We reviewed further evaluation for any significant underlying coronary artery disease with a CT coronary angiogram versus cardiac catheterization. He preferred less invasive method and at this time. Will request CT coronary angiogram to be done expedited, he is agreeable to go to [facility] to get this done at [facility].  Advised him to start taking aspirin 81 mg once daily he is agreeable to this. Will also obtain transthoracic echocardiogram for baseline cardiac structure and function assessment.

## 2023-08-11 ENCOUNTER — Ambulatory Visit: Payer: Medicaid Other

## 2023-08-11 DIAGNOSIS — R079 Chest pain, unspecified: Secondary | ICD-10-CM | POA: Diagnosis not present

## 2023-08-11 LAB — ECHOCARDIOGRAM COMPLETE: S' Lateral: 3.2 cm

## 2023-08-16 ENCOUNTER — Telehealth: Payer: Self-pay

## 2023-08-16 NOTE — Telephone Encounter (Signed)
-----   Message from Ophir R Madireddy sent at 08/16/2023 11:55 AM EST ----- Please inform him the test results show normal pumping function of the heart, with left ventricular ejection fraction 55 to 60%.  There is mildly increased stiffness of the heart muscle which can be an age-related change, but also can reflect elevated blood pressures over time.  No major valve abnormalities were noted on this study. Please inquire with him about the status of his CT coronary angiogram.  Thank you

## 2023-09-21 ENCOUNTER — Telehealth (HOSPITAL_COMMUNITY): Payer: Self-pay | Admitting: *Deleted

## 2023-09-21 NOTE — Telephone Encounter (Signed)
 Attempted to call patient regarding upcoming cardiac CT appointment. Left message on voicemail with name and callback number  Larey Brick RN Navigator Cardiac Imaging [facility] [phone] Office [phone] Cell

## 2023-09-22 ENCOUNTER — Ambulatory Visit (HOSPITAL_COMMUNITY): Admission: RE | Admit: 2023-09-22 | Source: Ambulatory Visit

## 2023-10-05 ENCOUNTER — Telehealth (HOSPITAL_COMMUNITY): Payer: Self-pay | Admitting: *Deleted

## 2023-10-05 NOTE — Telephone Encounter (Signed)
 Attempted to call patient regarding upcoming cardiac CT appointment. Left message on voicemail with name and callback number  Larey Brick RN Navigator Cardiac Imaging [facility] [phone] Office [phone] Cell

## 2023-10-06 ENCOUNTER — Ambulatory Visit (HOSPITAL_COMMUNITY): Admission: RE | Admit: 2023-10-06 | Source: Ambulatory Visit

## 2023-10-19 ENCOUNTER — Telehealth (HOSPITAL_COMMUNITY): Payer: Self-pay | Admitting: *Deleted

## 2023-10-19 NOTE — Telephone Encounter (Signed)
 Attempted to call patient regarding upcoming cardiac CT appointment. Left message on voicemail with name and callback number Johney Frame RN Navigator Cardiac Imaging [facility] [phone] Office

## 2023-10-20 ENCOUNTER — Ambulatory Visit (HOSPITAL_COMMUNITY): Admission: RE | Admit: 2023-10-20 | Source: Ambulatory Visit

## 2023-12-06 ENCOUNTER — Other Ambulatory Visit (HOSPITAL_COMMUNITY): Payer: Self-pay | Admitting: *Deleted

## 2023-12-06 ENCOUNTER — Telehealth (HOSPITAL_COMMUNITY): Payer: Self-pay | Admitting: Emergency Medicine

## 2023-12-06 MED ORDER — METOPROLOL TARTRATE 100 MG PO TABS: 100.0000 mg | ORAL_TABLET | Freq: Once | ORAL | 0 refills | Status: AC

## 2023-12-06 NOTE — Telephone Encounter (Signed)
 Unable to leave vm Rockwell Alexandria RN Navigator Cardiac Imaging [facility] [phone] Office  [phone] Cell

## 2023-12-06 NOTE — Telephone Encounter (Signed)
 Received call from patient regarding upcoming cardiac imaging study; pt verbalizes understanding of appt date/time, parking situation and where to check in, pre-test NPO status and medications ordered, and verified current allergies; name and call back number provided for further questions should they arise Johney Frame RN Navigator Cardiac Imaging [facility] [phone] office [phone] cell

## 2023-12-07 ENCOUNTER — Ambulatory Visit (HOSPITAL_COMMUNITY)
Admission: RE | Admit: 2023-12-07 | Discharge: 2023-12-07 | Disposition: A | Discharge status: Home / Self Care | Source: Ambulatory Visit

## 2023-12-07 ENCOUNTER — Ambulatory Visit: Payer: Self-pay

## 2023-12-07 DIAGNOSIS — I2510 Atherosclerotic heart disease of native coronary artery without angina pectoris: Secondary | ICD-10-CM | POA: Diagnosis not present

## 2023-12-07 DIAGNOSIS — R079 Chest pain, unspecified: Secondary | ICD-10-CM

## 2023-12-07 MED ORDER — NITROGLYCERIN 0.4 MG SL SUBL
0.8000 mg | SUBLINGUAL_TABLET | Freq: Once | SUBLINGUAL | Status: AC
  Administered 2023-12-07: 0.8 mg via SUBLINGUAL

## 2023-12-07 MED ORDER — IOHEXOL 350 MG/ML SOLN
100.0000 mL | Freq: Once | INTRAVENOUS | Status: AC | PRN
  Administered 2023-12-07: 100 mL via INTRAVENOUS

## 2023-12-08 NOTE — Telephone Encounter (Signed)
 Left vm to return call.

## 2023-12-08 NOTE — Telephone Encounter (Signed)
-----   Message from [place] R Madireddy sent at 12/07/2023  6:59 PM EDT ----- Please inform him the test results from his cardiac CT scan showed mild amount of plaque buildup. There is only mild amount of narrowing in 1 small area of the blood vessels that supply the heart. Otherwise no significant obstruction to the blood flow.  This is good news and reassuring.  Will review results from findings regarding structures outside of the heart once the radiologist report is available.  Please forward the results to patient's PCP.  Thank you  ----- Message ----- From: Interface, Rad Results In Sent: 12/07/2023   3:51 PM EDT To: Angelena Kells, MD
